# Patient Record
Sex: Male | Born: 2000 | Race: White | Hispanic: No | Marital: Single | Smoking: Never smoker
Health system: Southern US, Community
[De-identification: ages and names within clinical notes are randomized; demographics above are authoritative.]

## PROBLEM LIST (undated history)

## (undated) DIAGNOSIS — T8859XA Other complications of anesthesia, initial encounter: Secondary | ICD-10-CM

## (undated) DIAGNOSIS — K439 Ventral hernia without obstruction or gangrene: Secondary | ICD-10-CM

## (undated) DIAGNOSIS — Z8489 Family history of other specified conditions: Secondary | ICD-10-CM

## (undated) DIAGNOSIS — T4145XA Adverse effect of unspecified anesthetic, initial encounter: Secondary | ICD-10-CM

## (undated) HISTORY — PX: TYMPANOSTOMY TUBE PLACEMENT: SHX32

## (undated) HISTORY — PX: TONSILLECTOMY AND ADENOIDECTOMY: SHX28

---
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
## 2019-02-07 IMAGING — DX DG HAND COMPLETE 3+V*R*
3 series · 3 of 3 positions shown · non-contrast
Comparison: None.

CLINICAL DATA: Punched a boxing back today without a glove. Hand
pain.

EXAM:
RIGHT HAND - COMPLETE 3+ VIEW

[hand pa]
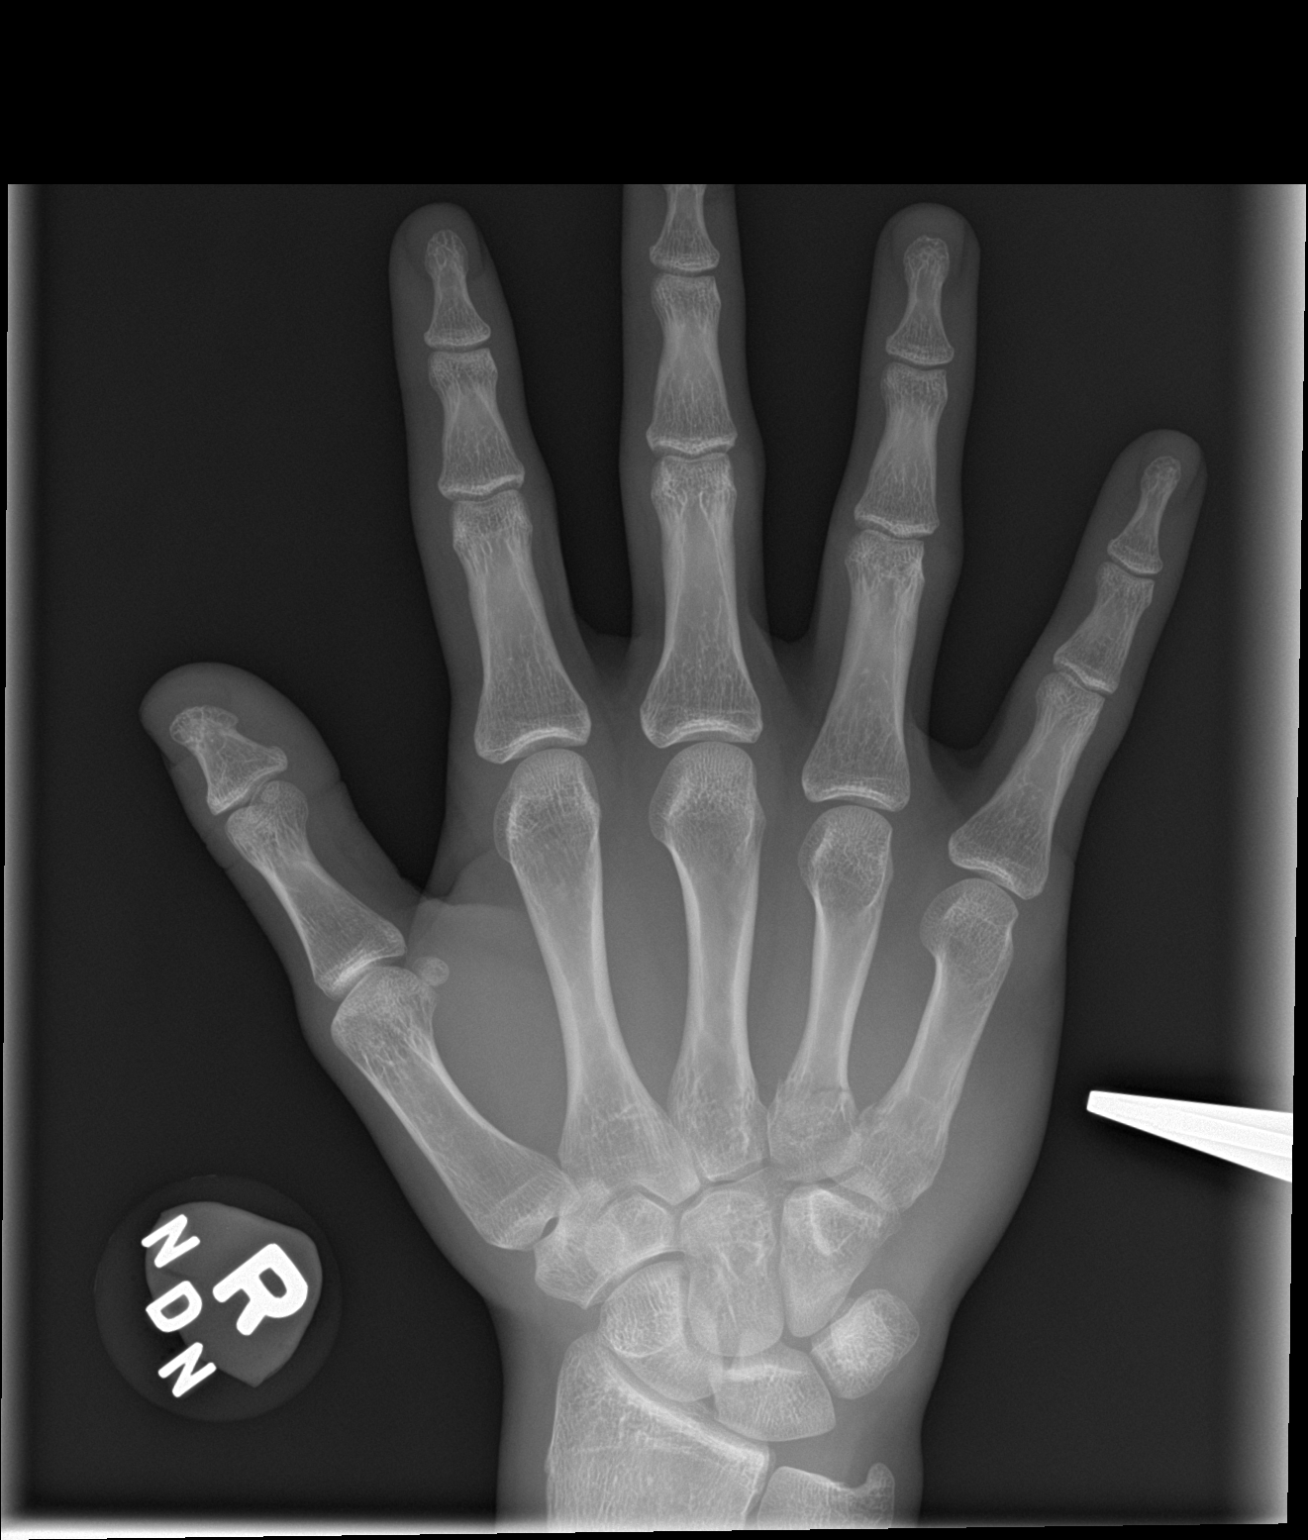

[hand obl]
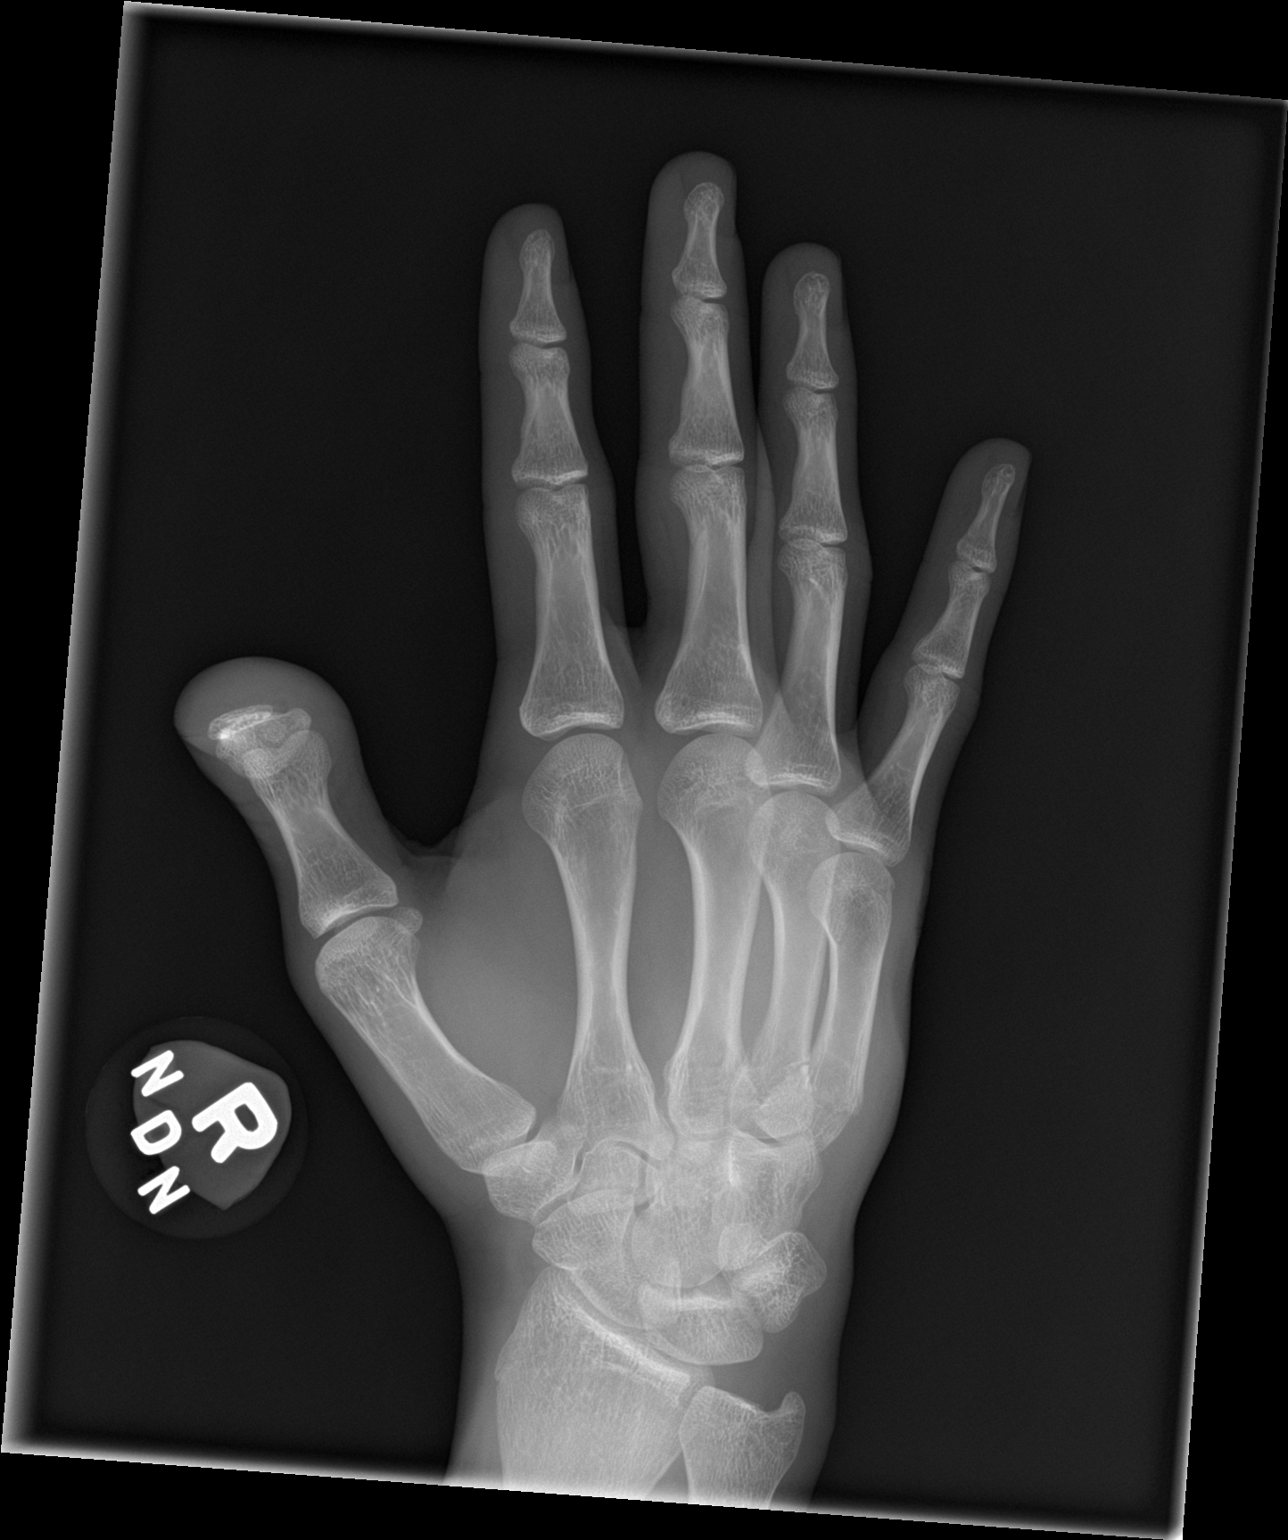

[hand lat]
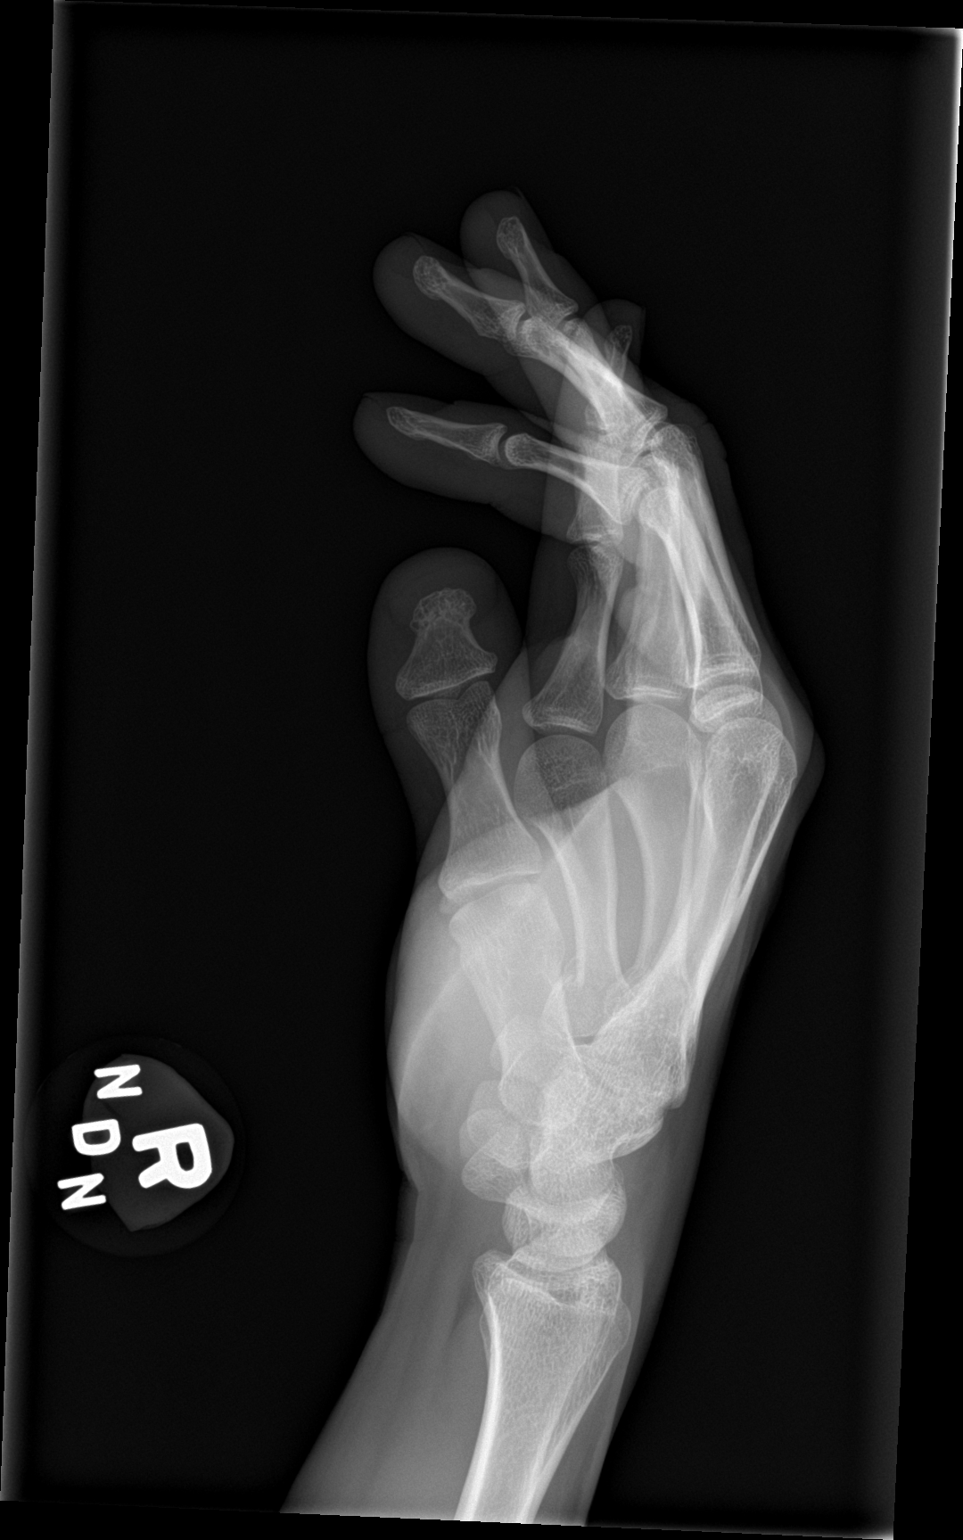

[3 of 3 positions shown; findings below may reference images not displayed]

FINDINGS: Transverse nondisplaced fracture of the base of the right fifth
carpal. Transverse nondisplaced fracture of the base of the fourth
metacarpal. No other fracture or dislocation.
IMPRESSION: 1. Transverse nondisplaced fracture of the base of the right fifth
carpal.
2. Transverse nondisplaced fracture of the base of the fourth
metacarpal.

## 2019-05-25 DIAGNOSIS — H52203 Unspecified astigmatism, bilateral: Secondary | ICD-10-CM | POA: Diagnosis not present

## 2019-05-25 DIAGNOSIS — H5213 Myopia, bilateral: Secondary | ICD-10-CM | POA: Diagnosis not present

## 2020-05-03 DIAGNOSIS — S63621A Sprain of interphalangeal joint of right thumb, initial encounter: Secondary | ICD-10-CM | POA: Diagnosis not present

## 2020-05-25 DIAGNOSIS — Z03818 Encounter for observation for suspected exposure to other biological agents ruled out: Secondary | ICD-10-CM | POA: Diagnosis not present

## 2020-06-06 DIAGNOSIS — Z03818 Encounter for observation for suspected exposure to other biological agents ruled out: Secondary | ICD-10-CM | POA: Diagnosis not present
